# Patient Record
Sex: Male | Born: 1955 | Race: White | Hispanic: No | Marital: Married | Smoking: Former smoker
Health system: Southern US, Community
[De-identification: ages and names within clinical notes are randomized; demographics above are authoritative.]

## PROBLEM LIST (undated history)

## (undated) DIAGNOSIS — E782 Mixed hyperlipidemia: Secondary | ICD-10-CM

## (undated) DIAGNOSIS — Z87891 Personal history of nicotine dependence: Secondary | ICD-10-CM

## (undated) HISTORY — DX: Mixed hyperlipidemia: E78.2

## (undated) HISTORY — DX: Personal history of nicotine dependence: Z87.891

## (undated) HISTORY — PX: TOE AMPUTATION: SHX809

---

## 2015-05-13 ENCOUNTER — Emergency Department (INDEPENDENT_AMBULATORY_CARE_PROVIDER_SITE_OTHER)
Admission: EM | Admit: 2015-05-13 | Discharge: 2015-05-13 | Disposition: A | Discharge status: Home / Self Care | Payer: BLUE CROSS/BLUE SHIELD | Source: Home / Self Care | Attending: Family Medicine | Admitting: Family Medicine

## 2015-05-13 ENCOUNTER — Encounter: Payer: Self-pay | Admitting: Emergency Medicine

## 2015-05-13 DIAGNOSIS — J019 Acute sinusitis, unspecified: Secondary | ICD-10-CM | POA: Diagnosis not present

## 2015-05-13 MED ORDER — FLUTICASONE PROPIONATE 50 MCG/ACT NA SUSP: 2.0000 | Freq: Every day | NASAL | Status: DC

## 2015-05-13 MED ORDER — AMOXICILLIN-POT CLAVULANATE 875-125 MG PO TABS: 1.0000 | ORAL_TABLET | Freq: Two times a day (BID) | ORAL | Status: DC

## 2015-05-13 NOTE — ED Provider Notes (Signed)
CSN: [id number]     Arrival date & time 05/13/15  1311 History   First MD Initiated Contact with Patient 05/13/15 1358     Chief Complaint  Patient presents with  . Facial Pain   (Consider location/radiation/quality/duration/timing/severity/associated sxs/prior Treatment) HPI  Pt is a 60yo male [facility] with c/o sinus pressure and pain with headaches and nosebleeds for 5 days.  Pt also reports intermittent dizziness.  He has been using a 12 hour decongestant nose spray every night "for a long time" because it helps him breath at night. Pt does not recall name of medication but states it is a generic.  Reports only a little congestion.  Pain is aching and sore, mild to moderate in severity, worse when trying to blow his nose. Denies fever, chills, n/v/d.   History reviewed. No pertinent past medical history. Past Surgical History  Procedure Laterality Date  . Toe amputation     Family History  Problem Relation Age of Onset  . Diabetes Mother   . Hyperlipidemia Mother   . Diabetes Sister   . Hyperlipidemia Sister    Social History  Substance Use Topics  . Smoking status: Former Games developermoker  . Smokeless tobacco: Current User  . Alcohol Use: No    Review of Systems  Constitutional: Negative for fever and chills.  HENT: Positive for congestion, hearing loss ( "clogged feeling"), nosebleeds, postnasal drip, rhinorrhea and sinus pressure. Negative for ear pain, sore throat, trouble swallowing and voice change.   Respiratory: Negative for cough and shortness of breath.   Cardiovascular: Negative for chest pain and palpitations.  Gastrointestinal: Negative for nausea, vomiting, abdominal pain and diarrhea.  Musculoskeletal: Negative for myalgias, back pain and arthralgias.  Skin: Negative for rash.  Neurological: Positive for dizziness and headaches. Negative for light-headedness.    Allergies  Review of patient's allergies indicates not on file.  Home Medications   Prior to  Admission medications   Medication Sig Start Date End Date Taking? Authorizing Provider  amoxicillin-clavulanate (AUGMENTIN) 875-125 MG tablet Take 1 tablet by mouth 2 (two) times daily. For 10 days 05/13/15   Junius FinnerErin O'Malley, PA-C  fluticasone [facility](FLONASE) 50 MCG/ACT nasal spray Place 2 sprays into both nostrils daily. 05/13/15   Junius FinnerErin O'Malley, PA-C   Meds Ordered and Administered this Visit  Medications - No data to display  BP 126/85 mmHg  Pulse 96  Temp(Src) 98.1 F (36.7 C) (Oral)  Wt 226 lb (102.513 kg)  SpO2 97% No data found.   Physical Exam  Constitutional: He appears well-developed and well-nourished.  HENT:  Head: Normocephalic and atraumatic.  Right Ear: Hearing, tympanic membrane, external ear and ear canal normal.  Left Ear: Hearing, tympanic membrane, external ear and ear canal normal.  Nose: Mucosal edema present. Right sinus exhibits maxillary sinus tenderness and frontal sinus tenderness. Left sinus exhibits maxillary sinus tenderness and frontal sinus tenderness.  Mouth/Throat: Uvula is midline, oropharynx is clear and moist and mucous membranes are normal.  Eyes: Conjunctivae are normal. No scleral icterus.  Neck: Normal range of motion. Neck supple.  Cardiovascular: Normal rate, regular rhythm and normal heart sounds.   Pulmonary/Chest: Effort normal and breath sounds normal. No respiratory distress. He has no wheezes. He has no rales. He exhibits no tenderness.  Abdominal: Soft. He exhibits no distension and no mass. There is no tenderness. There is no rebound and no guarding.  Genitourinary: Penis normal.  Musculoskeletal: Normal range of motion.  Neurological: He is alert.  Skin: Skin is  warm and dry.  Nursing note and vitals reviewed.   ED Course  Procedures (including critical care time)  Labs Review Labs Reviewed - No data to display  Imaging Review No results found.    MDM   1. Acute rhinosinusitis      Pt c/o facial pain, nose bleeds, and  dizziness. Reports using a 12 hours decongestant nose spray nightly for "a long time."    Will tx for bacteria cause of sinus pain, however, explained to pt symptoms may be coming from over use of the nasal spray as only some medications are meant to be used daily, others are meant only for 3 consecutive days. Encouraged pt to read instructions on his nose spray carefully.   Rx: augmentin and flonase. Advised pt he can also use over the counter nasal saline, which he can use multiple times a day if needed.  Advised pt to use acetaminophen and ibuprofen as needed for fever and pain. Encouraged rest and fluids. F/u with PCP in 7-10 days if not improving, sooner if worsening. Pt verbalized understanding and agreement with tx plan.     Junius Finner, PA-C 05/14/15 [phone]

## 2015-05-13 NOTE — Discharge Instructions (Signed)
You may take 400-600mg  Ibuprofen (Motrin) every 6-8 hours for fever and pain  Alternate with Tylenol  You may take 500mg  Tylenol every 4-6 hours as needed for fever and pain  Follow-up with your primary care provider next week for recheck of symptoms if not improving.  Be sure to drink plenty of fluids and rest, at least 8hrs of sleep a night, preferably more while you are sick. Return urgent care or go to closest ER if you cannot keep down fluids/signs of dehydration, fever not reducing with Tylenol, difficulty breathing/wheezing, stiff neck, worsening condition, or other concerns (see below)  Please take antibiotics as prescribed and be sure to complete entire course even if you start to feel better to ensure infection does not come back.  Please read the instructions on the back of the nose spray that you have been using at night.  Some medications are only meant for a short 3-4 day period.  If used continuously, it may worsen your sinus symptoms including increased sinus pain, irritation, and drying.

## 2015-05-13 NOTE — ED Notes (Signed)
Pt c/o facial pain, HA and sinus pressure x5 days, states her had a nosebleed on Thursday and is having intermittent dizziness.

## 2016-06-23 ENCOUNTER — Encounter: Payer: Self-pay | Admitting: Sports Medicine

## 2016-06-23 ENCOUNTER — Ambulatory Visit (INDEPENDENT_AMBULATORY_CARE_PROVIDER_SITE_OTHER): Payer: BLUE CROSS/BLUE SHIELD

## 2016-06-23 ENCOUNTER — Ambulatory Visit (INDEPENDENT_AMBULATORY_CARE_PROVIDER_SITE_OTHER): Payer: BLUE CROSS/BLUE SHIELD | Admitting: Sports Medicine

## 2016-06-23 DIAGNOSIS — Z0000 Encounter for general adult medical examination without abnormal findings: Secondary | ICD-10-CM

## 2016-06-23 DIAGNOSIS — H9193 Unspecified hearing loss, bilateral: Secondary | ICD-10-CM | POA: Diagnosis not present

## 2016-06-23 DIAGNOSIS — H9190 Unspecified hearing loss, unspecified ear: Secondary | ICD-10-CM | POA: Insufficient documentation

## 2016-06-23 DIAGNOSIS — M170 Bilateral primary osteoarthritis of knee: Secondary | ICD-10-CM

## 2016-06-23 DIAGNOSIS — E782 Mixed hyperlipidemia: Secondary | ICD-10-CM

## 2016-06-23 DIAGNOSIS — J310 Chronic rhinitis: Secondary | ICD-10-CM

## 2016-06-23 LAB — COMPREHENSIVE METABOLIC PANEL
ALT: 21 U/L (ref 9–46)
AST: 21 U/L (ref 10–35)
Alkaline Phosphatase: 70 U/L (ref 40–115)
BUN: 16 mg/dL (ref 7–25)
Calcium: 9.6 mg/dL (ref 8.6–10.3)
Creat: 1 mg/dL (ref 0.70–1.33)
Glucose, Bld: 107 mg/dL — ABNORMAL HIGH (ref 65–99)
Potassium: 4.8 mmol/L (ref 3.5–5.3)
Sodium: 142 mmol/L (ref 135–146)
Total Protein: 7.4 g/dL (ref 6.1–8.1)

## 2016-06-23 LAB — LIPID PANEL W/REFLEX DIRECT LDL
Cholesterol: 257 mg/dL — ABNORMAL HIGH (ref <200)
HDL: 30 mg/dL — ABNORMAL LOW (ref >40)
LDL-Cholesterol: 193 mg/dL — ABNORMAL HIGH
Non-HDL Cholesterol (Calc): 227 mg/dL — ABNORMAL HIGH (ref <130)
Total CHOL/HDL Ratio: 8.6 ratio — ABNORMAL HIGH (ref <5.0)
Triglycerides: 171 mg/dL — ABNORMAL HIGH (ref <150)

## 2016-06-23 LAB — CBC
HCT: 42.7 % (ref 38.5–50.0)
Hemoglobin: 14.2 g/dL (ref 13.2–17.1)
MCH: 28.2 pg (ref 27.0–33.0)
MCHC: 33.3 g/dL (ref 32.0–36.0)
MCV: 84.9 fL (ref 80.0–100.0)
MPV: 9.3 fL (ref 7.5–12.5)
Platelets: 194 10*3/uL (ref 140–400)
RBC: 5.03 MIL/uL (ref 4.20–5.80)
RDW: 14.2 % (ref 11.0–15.0)
WBC: 6.3 10*3/uL (ref 3.8–10.8)

## 2016-06-23 LAB — COMPREHENSIVE METABOLIC PANEL WITH GFR
Albumin: 4.2 g/dL (ref 3.6–5.1)
CO2: 29 mmol/L (ref 20–31)
Chloride: 106 mmol/L (ref 98–110)
Total Bilirubin: 0.7 mg/dL (ref 0.2–1.2)

## 2016-06-23 LAB — TSH: TSH: 1.53 mIU/L (ref 0.40–4.50)

## 2016-06-23 LAB — HEMOGLOBIN A1C
Hgb A1c MFr Bld: 5.9 % — ABNORMAL HIGH (ref <5.7)
Mean Plasma Glucose: 123 mg/dL

## 2016-06-23 MED ORDER — FLUTICASONE PROPIONATE 50 MCG/ACT NA SUSP: NASAL | 3 refills | Status: DC

## 2016-06-23 MED ORDER — MELOXICAM 15 MG PO TABS: ORAL_TABLET | ORAL | 3 refills | Status: DC

## 2016-06-23 NOTE — Assessment & Plan Note (Signed)
X-rays, switch to meloxicam from aspirin. Home rehabilitation exercises, pain is predominantly patellofemoral.

## 2016-06-23 NOTE — Assessment & Plan Note (Signed)
Patient has a long history of decongestant use, may continue nasal saline but needs to add Flonase

## 2016-06-23 NOTE — Progress Notes (Signed)
  Subjective:    CC: Establish care.   HPI:  Brian Boyer is a pleasant 61 year old male, he is here for a physical, he also has a couple of complaints.  Runny nose: Present year-round, has only been using a decongestant and nasal saline. Occasionally gets a stuffy sensation in his left ear. This also create some degree of hearing loss with occasional tinnitus. Sounds as though he's never tried a nasal steroid.  Knee pain: Bilateral, anterior, worse with sitting, squatting, going up and down stairs, tries some aspirin but has really never had another NSAID. No mechanical symptoms, only minimal swelling..  Past medical history:  Negative.  See flowsheet/record as well for more information.  Surgical history: Negative.  See flowsheet/record as well for more information.  Family history: Negative.  See flowsheet/record as well for more information.  Social history: Negative.  See flowsheet/record as well for more information.  Allergies, and medications have been entered into the medical record, reviewed, and no changes needed.    Review of Systems: No headache, visual changes, nausea, vomiting, diarrhea, constipation, dizziness, abdominal pain, skin rash, fevers, chills, night sweats, swollen lymph nodes, weight loss, chest pain, body aches, joint swelling, muscle aches, shortness of breath, mood changes, visual or auditory hallucinations.  Objective:    General: Well Developed, well nourished, and in no acute distress.  Neuro: Alert and oriented x3, extra-ocular muscles intact, sensation grossly intact. Cranial nerves II through XII are intact, motor, sensory, and coordinative functions are all intact. HEENT: Normocephalic, atraumatic, pupils equal round reactive to light, neck supple, no masses, no lymphadenopathy, thyroid nonpalpable. Oropharynx, nasopharynx, external ear canals are unremarkable. Skin: Warm and dry, no rashes noted.  Cardiac: Regular rate and rhythm, no murmurs rubs or gallops.    Respiratory: Clear to auscultation bilaterally. Not using accessory muscles, speaking in full sentences.  Abdominal: Soft, nontender, nondistended, positive bowel sounds, no masses, no organomegaly.  Musculoskeletal: Shoulder, elbow, wrist, hip, knee, ankle stable, and with full range of motion. Bilateral knees: Only minimal swelling, he did have tenderness under the medial and lateral patellar facets of both knees ROM normal in flexion and extension and lower leg rotation. Ligaments with solid consistent endpoints including ACL, PCL, LCL, MCL. Negative Mcmurray's and provocative meniscal tests. Non painful patellar compression. Patellar and quadriceps tendons unremarkable. Hamstring and quadriceps strength is normal.  Mild osteoarthritis on both knee x-rays.  Impression and Recommendations:    The patient was counselled, risk factors were discussed, anticipatory guidance given.  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 Home rehabilitation exercises, pain is predominantly patellofemoral.  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 Referral to audiology  Chronic rhinitis Patient has a long history of decongestant use, may continue nasal saline but needs to add Flonase  Annual physical exam Checking routine blood work, getting him set up for ColoGuard.

## 2016-06-23 NOTE — Assessment & Plan Note (Signed)
Checking routine blood work, getting him set up for ColoGuard.

## 2016-06-23 NOTE — Assessment & Plan Note (Signed)
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 Referral to audiology

## 2016-06-24 DIAGNOSIS — E782 Mixed hyperlipidemia: Secondary | ICD-10-CM

## 2016-06-24 HISTORY — DX: Mixed hyperlipidemia: E78.2

## 2016-06-24 LAB — HIV ANTIBODY (ROUTINE TESTING W REFLEX): HIV 1&2 Ab, 4th Generation: NONREACTIVE

## 2016-06-24 LAB — VITAMIN D 25 HYDROXY (VIT D DEFICIENCY, FRACTURES): Vit D, 25-Hydroxy: 27 ng/mL — ABNORMAL LOW (ref 30–100)

## 2016-06-24 LAB — HEPATITIS C ANTIBODY: HCV Ab: NEGATIVE

## 2016-06-24 LAB — RHEUMATOID FACTOR: Rheumatoid fact SerPl-aCnc: <14 [IU]/mL (ref <14)

## 2016-06-24 LAB — CYCLIC CITRUL PEPTIDE ANTIBODY, IGG: Cyclic Citrullin Peptide Ab: <16 Units

## 2016-06-24 LAB — URIC ACID: Uric Acid, Serum: 5.7 mg/dL (ref 4.0–8.0)

## 2016-06-24 MED ORDER — VITAMIN D (ERGOCALCIFEROL) 1.25 MG (50000 UNIT) PO CAPS: 50000.0000 [IU] | ORAL_CAPSULE | ORAL | 0 refills | Status: DC

## 2016-06-24 NOTE — Assessment & Plan Note (Addendum)
Awaiting patient approval to start statin..  Starting Lipitor 20, recheck in 3 months

## 2016-06-24 NOTE — Addendum Note (Signed)
Addended by: Monica BectonHEKKEKANDAM, THOMAS J on: 06/24/2016 05:51 PM   Modules accepted: Orders

## 2016-06-26 MED ORDER — ATORVASTATIN CALCIUM 20 MG PO TABS: 20.0000 mg | ORAL_TABLET | Freq: Every day | ORAL | 0 refills | Status: DC

## 2016-06-26 NOTE — Addendum Note (Signed)
Addended by: Monica BectonHEKKEKANDAM, Drea Jurewicz J on: 06/26/2016 08:32 AM   Modules accepted: Orders

## 2016-07-21 ENCOUNTER — Ambulatory Visit: Payer: BLUE CROSS/BLUE SHIELD | Admitting: Sports Medicine

## 2016-07-29 ENCOUNTER — Other Ambulatory Visit: Payer: Self-pay

## 2016-07-29 DIAGNOSIS — E782 Mixed hyperlipidemia: Secondary | ICD-10-CM

## 2016-07-29 MED ORDER — ATORVASTATIN CALCIUM 20 MG PO TABS: 20.0000 mg | ORAL_TABLET | Freq: Every day | ORAL | 3 refills | Status: DC

## 2017-06-01 ENCOUNTER — Encounter: Payer: Self-pay | Admitting: Family Medicine

## 2017-06-01 ENCOUNTER — Ambulatory Visit (INDEPENDENT_AMBULATORY_CARE_PROVIDER_SITE_OTHER): Payer: BLUE CROSS/BLUE SHIELD | Admitting: Family Medicine

## 2017-06-01 VITALS — BP 150/77 | HR 79 | Ht 73.0 in | Wt 230.0 lb

## 2017-06-01 DIAGNOSIS — B029 Zoster without complications: Secondary | ICD-10-CM

## 2017-06-01 MED ORDER — GABAPENTIN 300 MG PO CAPS: ORAL_CAPSULE | ORAL | 3 refills | Status: DC

## 2017-06-01 MED ORDER — VALACYCLOVIR HCL 1 G PO TABS: 1000.0000 mg | ORAL_TABLET | Freq: Three times a day (TID) | ORAL | 1 refills | Status: DC

## 2017-06-01 NOTE — Patient Instructions (Signed)
Thank you for coming in today. You have shingles.  Take valtrex three times daily for 1 week.  Take gabapentin as needed for pain.  Recheck as needed.    Shingles Shingles, which is also known as herpes zoster, is an infection that causes a painful skin rash and fluid-filled blisters. Shingles is not related to genital herpes, which is a sexually transmitted infection. Shingles only develops in people who:  Have had chickenpox.  Have received the chickenpox vaccine. (This is rare.)  What are the causes? Shingles is caused by varicella-zoster virus (VZV). This is the same virus that causes chickenpox. After exposure to VZV, the virus stays in the body in an inactive (dormant) state. Shingles develops if the virus reactivates. This can happen many years after the initial exposure to VZV. It is not known what causes this virus to reactivate. What increases the risk? People who have had chickenpox or received the chickenpox vaccine are at risk for shingles. Infection is more common in people who:  Are older than age 61.  Have a weakened defense (immune) system, such as those with HIV, AIDS, or cancer.  Are taking medicines that weaken the immune system, such as transplant medicines.  Are under great stress.  What are the signs or symptoms? Early symptoms of this condition include itching, tingling, and pain in an area on your skin. Pain may be described as burning, stabbing, or throbbing. A few days or weeks after symptoms start, a painful red rash appears, usually on one side of the body in a bandlike or beltlike pattern. The rash eventually turns into fluid-filled blisters that break open, scab over, and dry up in about 2-3 weeks. At any time during the infection, you may also develop:  A fever.  Chills.  A headache.  An upset stomach.  How is this diagnosed? This condition is diagnosed with a skin exam. Sometimes, skin or fluid samples are taken from the blisters before a  diagnosis is made. These samples are examined under a microscope or sent to a lab for testing. How is this treated? There is no specific cure for this condition. Your health care provider will probably prescribe medicines to help you manage pain, recover more quickly, and avoid long-term problems. Medicines may include:  Antiviral drugs.  Anti-inflammatory drugs.  Pain medicines.  If the area involved is on your face, you may be referred to a specialist, such as an eye doctor (ophthalmologist) or an ear, nose, and throat (ENT) doctor to help you avoid eye problems, chronic pain, or disability. Follow these instructions at home: Medicines  Take medicines only as directed by your health care provider.  Apply an anti-itch or numbing cream to the affected area as directed by your health care provider. Blister and Rash Care  Take a cool bath or apply cool compresses to the area of the rash or blisters as directed by your health care provider. This may help with pain and itching.  Keep your rash covered with a loose bandage (dressing). Wear loose-fitting clothing to help ease the pain of material rubbing against the rash.  Keep your rash and blisters clean with mild soap and cool water or as directed by your health care provider.  Check your rash every day for signs of infection. These include redness, swelling, and pain that lasts or increases.  Do not pick your blisters.  Do not scratch your rash. General instructions  Rest as directed by your health care provider.  Keep all follow-up  visits as directed by your health care provider. This is important.  Until your blisters scab over, your infection can cause chickenpox in people who have never had it or been vaccinated against it. To prevent this from happening, avoid contact with other people, especially: ? Babies. ? Pregnant women. ? Children who have eczema. ? Elderly people who have transplants. ? People who have chronic  illnesses, such as leukemia or AIDS. Contact a health care provider if:  Your pain is not relieved with prescribed medicines.  Your pain does not get better after the rash heals.  Your rash looks infected. Signs of infection include redness, swelling, and pain that lasts or increases. Get help right away if:  The rash is on your face or nose.  You have facial pain, pain around your eye area, or loss of feeling on one side of your face.  You have ear pain or you have ringing in your ear.  You have loss of taste.  Your condition gets worse. This information is not intended to replace advice given to you by your health care provider. Make sure you discuss any questions you have with your health care provider. Document Released: 04/28/2005 Document Revised: 12/23/2015 Document Reviewed: 03/09/2014 Elsevier Interactive Patient Education  2018 [employer].

## 2017-06-01 NOTE — Progress Notes (Signed)
Brian Boyer is a 62 y.o. male who [facility] today for pain and rash.  Markelle notes onset of pain across his right trunk last week with onset of rash less than 72 hours ago Saturday.  He notes the rash is spread along his ribs on his right side.  He notes that he was sick preceding the pain and rash but is feeling better now with no fevers or chills vomiting or diarrhea.  He has not tried any treatment yet.  He suspects shingles based on the appearance of the rash although he has never had shingles in the past.  He notes that he had chickenpox as a child.   Past Medical History:  Diagnosis Date  . Hyperlipidemia, mixed 06/24/2016   Past Surgical History:  Procedure Laterality Date  . TOE AMPUTATION     Social History   Tobacco Use  . Smoking status: Former Games developermoker  . Smokeless tobacco: Current User  Substance Use Topics  . Alcohol use: No   family history includes Diabetes in his mother and sister; Hyperlipidemia in his mother and sister.  ROS as above:  Medications: Current Outpatient Medications  Medication Sig Dispense Refill  . atorvastatin (LIPITOR) 20 MG tablet Take 1 tablet (20 mg total) by mouth daily. (Patient not taking: Reported on 06/01/2017) 90 tablet 3  . gabapentin (NEURONTIN) 300 MG capsule One tab PO qHS for a week, then BID for a week, then TID. May double weekly to a max of 3,600mg /day 180 capsule 3  . valACYclovir (VALTREX) 1000 MG tablet Take 1 tablet (1,000 mg total) by mouth 3 (three) times daily. 21 tablet 1   No current facility-administered medications for this visit.    No Known Allergies  Health Maintenance Health Maintenance  Topic Date Due  . TETANUS/TDAP  04/10/1976  . COLONOSCOPY  04/11/2007  . INFLUENZA VACCINE  12/10/2016  . Hepatitis C Screening  Completed  . HIV Screening  Completed     Exam:  BP (!)  150/77   Pulse 79   Ht 6\' 1"  (1.854 m)   Wt 230 lb (104.3 kg)   BMI 30.34 kg/m  Gen: Well NAD HEENT: EOMI,  MMM Lungs: Normal work of breathing. CTABL Heart: RRR no MRG Abd: NABS, Soft. Nondistended, Nontender Exts: Brisk capillary refill, warm and well perfused.  Trunk: Erythematous red plaque-like rash in a dermatomal pattern with clear vesicles consistent in appearance with shingles  Lab Results  Component Value Date   CREATININE 1.00 06/23/2016      Assessment and Plan: 62 y.o. male with shingles.  Patient is still within treatment window.  Treatment with Valtrex and gabapentin.  Patient has normal renal function less than 1 year ago therefore full dose of Valtrex is reasonable.  Follow-up with PCP as needed.  Advised patient to avoid contact with folks who are immunocompromised or very young. Handout provided.    No orders of the defined types were placed in this encounter.  Meds ordered this encounter  Medications  . valACYclovir (VALTREX) 1000 MG tablet    Sig: Take 1 tablet (1,000 mg total) by mouth 3 (three) times daily.    Dispense:  21 tablet    Refill:  1  . gabapentin (NEURONTIN) 300 MG capsule    Sig: One tab PO qHS for a week, then BID for a week, then TID. May double weekly to a max of 3,600mg /day  Dispense:  180 capsule    Refill:  3     Discussed warning signs or symptoms. Please see discharge instructions. Patient expresses understanding.  I spent 25 minutes with this patient, greater than 50% was face-to-face time counseling regarding ddx and treatment plan.

## 2017-06-25 ENCOUNTER — Other Ambulatory Visit: Payer: Self-pay | Admitting: Sports Medicine

## 2017-06-25 DIAGNOSIS — E782 Mixed hyperlipidemia: Secondary | ICD-10-CM

## 2017-12-10 ENCOUNTER — Ambulatory Visit (INDEPENDENT_AMBULATORY_CARE_PROVIDER_SITE_OTHER): Payer: Self-pay | Admitting: Physician Assistant

## 2017-12-10 ENCOUNTER — Ambulatory Visit (INDEPENDENT_AMBULATORY_CARE_PROVIDER_SITE_OTHER): Payer: Self-pay

## 2017-12-10 ENCOUNTER — Encounter: Payer: Self-pay | Admitting: Physician Assistant

## 2017-12-10 VITALS — BP 130/62 | HR 63 | Temp 97.8°F | Wt 224.0 lb

## 2017-12-10 DIAGNOSIS — R5383 Other fatigue: Secondary | ICD-10-CM

## 2017-12-10 DIAGNOSIS — R5381 Other malaise: Secondary | ICD-10-CM

## 2017-12-10 DIAGNOSIS — Z87891 Personal history of nicotine dependence: Secondary | ICD-10-CM

## 2017-12-10 DIAGNOSIS — R0602 Shortness of breath: Secondary | ICD-10-CM

## 2017-12-10 DIAGNOSIS — F17229 Nicotine dependence, chewing tobacco, with unspecified nicotine-induced disorders: Secondary | ICD-10-CM

## 2017-12-10 DIAGNOSIS — R42 Dizziness and giddiness: Secondary | ICD-10-CM

## 2017-12-10 LAB — GLUCOSE, POCT (MANUAL RESULT ENTRY): POC Glucose: 101 mg/dl — AB (ref 70–99)

## 2017-12-10 NOTE — Progress Notes (Signed)
HPI:                                                                Brian Boyer is a 62 y.o. male who presents to [facility]: Primary Care Sports Medicine today for malaise/lightheadedness  History is provided by patient and his wife.  [name] with PMH of GERD, smokeless tobacco dependence, former cigarette smoker who presents with 2 weeks of vague symptoms, which are gradually improving.  Reports intermittent lightheadedness, generalized weakness, fatigue/malaise, muscle cramps, dyspnea and GI upset. Symptoms have been waxing and waning. Symptoms began after exerting himself outdoors in the heat. Wife states he has not been himself, sleeping more than usual and less active for several weeks. He has been drinking OJ, Gatorade and eating light salt to hydrate.   No flowsheet data found.  No flowsheet data found.    Past Medical History:  Diagnosis Date  . Former smoker, stopped smoking in distant past   . Hyperlipidemia, mixed 06/24/2016   Past Surgical History:  Procedure Laterality Date  . TOE AMPUTATION     Social History   Tobacco Use  . Smoking status: Former Smoker    Packs/day: 1.00    Years: 15.00    Pack years: 15.00    Types: Cigarettes    Last attempt to quit: 04/10/1998    Years since quitting: 19.7  . Smokeless tobacco: Current User    Types: Chew  Substance Use Topics  . Alcohol use: No   family history includes Diabetes in his mother and sister; Hyperlipidemia in his mother and sister.    ROS: Review of Systems  Constitutional: Positive for malaise/fatigue. Negative for chills and fever.  Eyes: Negative for blurred vision and double vision.  Respiratory: Positive for shortness of breath.   Cardiovascular: Negative.   Gastrointestinal: Positive for abdominal pain.       + loose stools  Genitourinary: Negative.   Neurological: Positive for dizziness and weakness. Negative for focal weakness and headaches.   Psychiatric/Behavioral: The patient is nervous/anxious.      Medications: Current Outpatient Medications  Medication Sig Dispense Refill  . omeprazole (PRILOSEC) 20 MG capsule Take 20 mg by mouth daily.    Marland Kitchen atorvastatin (LIPITOR) 20 MG tablet TAKE 1 TABLET DAILY 90 tablet 3   No current facility-administered medications for this visit.    No Known Allergies     Objective:  BP 130/62   Pulse 63   Temp 97.8 F (36.6 C) (Oral)   Wt 224 lb (101.6 kg)   SpO2 96%   BMI 29.55 kg/m  Gen:  alert, not ill-appearing, no distress, appropriate for age HEENT: head normocephalic without obvious abnormality, conjunctiva and cornea clear, trachea midline Pulm: Normal work of breathing, normal phonation, clear to auscultation bilaterally, no wheezes, rales or rhonchi CV: Normal rate, regular rhythm, s1 and s2 distinct, no murmurs, clicks or rubs  Neuro: alert and oriented x 3, no tremor MSK: extremities atraumatic, normal gait and station Skin: intact, no rashes on exposed skin, no jaundice, no cyanosis Psych: well-groomed, cooperative, good eye contact, euthymic mood, affect mood-congruent, speech is articulate, and thought processes clear and goal-directed  No data found.   No results found for this or any previous visit (  from the past 72 hour(s)). No results found.    Assessment and Plan: 62 y.o. 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  Former smoker, stopped smoking in distant past   - Afebrile, no tachypnea, no tachycardia, no orthostasis, SpO2 96% on RA at rest - POC  glucose wnl - CXR, UA, CBC and CMP pending to assess for anemia, electrolyte disturbance, AKI, or sign of systemic infection to explain malaise/fatigue - I think it is reassuring that his symptoms are gradually improving - encouraged PO hydration, taking time with position changes, wearing compression socks/stockings, and increasing dietary salt - encouraged to follow-up with PCP for routine cancer screening / preventive care   Patient education and anticipatory guidance given Patient agrees with treatment plan Follow-up with PCP in 1 week for dyspnea/spirometry or sooner as needed if symptoms worsen or fail to improve  Levonne Hubertharley E. Cummings PA-C

## 2017-12-10 NOTE — Patient Instructions (Addendum)
- Drink enough fluids to keep your urine pale yellow.  - Avoid caffeine, sweetened beverages and soda (which can dehydrate you further) - Take time with position changes, especially sitting to standing - Wear compression socks - Increase dietary salt  Orthostatic Hypotension Orthostatic hypotension is a sudden drop in blood pressure that happens when you quickly change positions, such as when you get up from a seated or lying position. Blood pressure is a measurement of how strongly, or weakly, your blood is pressing against the walls of your arteries. Arteries are blood vessels that carry blood from your heart throughout your body. When blood pressure is too low, you may not get enough blood to your brain or to the rest of your organs. This can cause weakness, light-headedness, rapid heartbeat, and fainting. This can last for just a few seconds or for up to a few minutes. Orthostatic hypotension is usually not a serious problem. However, if it happens frequently or gets worse, it may be a sign of something more serious. What are the causes? This condition may be caused by:  Sudden changes in posture, such as standing up quickly after you have been sitting or lying down.  Blood loss.  Loss of body fluids (dehydration).  Heart problems.  Hormone (endocrine) problems.  Pregnancy.  Severe infection.  Lack of certain nutrients.  Severe allergic reactions (anaphylaxis).  Certain medicines, such as blood pressure medicine or medicines that make the body lose excess fluids (diuretics). Sometimes, this condition can be caused by not taking medicine as directed, such as taking too much of a certain medicine.  What increases the risk? Certain factors can make you more likely to develop orthostatic hypotension, including:  Age. Risk increases as you get older.  Conditions that affect the heart or the central nervous system.  Taking certain medicines, such as blood pressure medicine or  diuretics.  Being pregnant.  What are the signs or symptoms? Symptoms of this condition may include:  Weakness.  Light-headedness.  Dizziness.  Blurred vision.  Fatigue.  Rapid heartbeat.  Fainting, in severe cases.  How is this diagnosed? This condition is diagnosed based on:  Your medical history.  Your symptoms.  Your blood pressure measurement. Your health care provider will check your blood pressure when you are: ? Lying down. ? Sitting. ? Standing.  A blood pressure reading is recorded as two numbers, such as "120 over 80" (or 120/80). The first ("top") number is called the systolic pressure. It is a measure of the pressure in your arteries as your heart beats. The second ("bottom") number is called the diastolic pressure. It is a measure of the pressure in your arteries when your heart relaxes between beats. Blood pressure is measured in a unit called mm Hg. Healthy blood pressure for adults is 120/80. If your blood pressure is below 90/60, you may be diagnosed with hypotension. Other information or tests that may be used to diagnose orthostatic hypotension include:  Your other vital signs, such as your heart rate and temperature.  Blood tests.  Tilt table test. For this test, you will be safely secured to a table that moves you from a lying position to an upright position. Your heart rhythm and blood pressure will be monitored during the test.  How is this treated? Treatment for this condition may include:  Changing your diet. This may involve eating more salt (sodium) or drinking more water.  Taking medicines to raise your blood pressure.  Changing the dosage of certain  medicines you are taking that might be lowering your blood pressure.  Wearing compression stockings. These stockings help to prevent blood clots and reduce swelling in your legs.  In some cases, you may need to go to the hospital for:  Fluid replacement. This means you will receive  fluids through an IV tube.  Blood replacement. This means you will receive donated blood through an IV tube (transfusion).  Treating an infection or heart problems, if this applies.  Monitoring. You may need to be monitored while medicines that you are taking wear off.  Follow these instructions at home: Eating and drinking   Drink enough fluid to keep your urine clear or pale yellow.  Eat a healthy diet and follow instructions from your health care provider about eating or drinking restrictions. A healthy diet includes: ? Fresh fruits and vegetables. ? Whole grains. ? Lean meats. ? Low-fat dairy products.  Eat extra salt only as directed. Do not add extra salt to your diet unless your health care provider told you to do that.  Eat frequent, small meals.  Avoid standing up suddenly after eating. Medicines  Take over-the-counter and prescription medicines only as told by your health care provider. ? Follow instructions from your health care provider about changing the dosage of your current medicines, if this applies. ? Do not stop or adjust any of your medicines on your own. General instructions  Wear compression stockings as told by your health care provider.  Get up slowly from lying down or sitting positions. This gives your blood pressure a chance to adjust.  Avoid hot showers and excessive heat as directed by your health care provider.  Return to your normal activities as told by your health care provider. Ask your health care provider what activities are safe for you.  Do not use any products that contain nicotine or tobacco, such as cigarettes and e-cigarettes. If you need help quitting, ask your health care provider.  Keep all follow-up visits as told by your health care provider. This is important. Contact a health care provider if:  You vomit.  You have diarrhea.  You have a fever for more than 2-3 days.  You feel more thirsty than usual.  You feel weak  and tired. Get help right away if:  You have chest pain.  You have a fast or irregular heartbeat.  You develop numbness in any part of your body.  You cannot move your arms or your legs.  You have trouble speaking.  You become sweaty or feel lightheaded.  You faint.  You feel short of breath.  You have trouble staying awake.  You feel confused. This information is not intended to replace advice given to you by your health care provider. Make sure you discuss any questions you have with your health care provider. Document Released: 04/18/2002 Document Revised: 01/15/2016 Document Reviewed: 10/19/2015 Elsevier Interactive Patient Education  2018 [employer]   Shortness of Breath, Adult Shortness of breath is when a person has trouble breathing enough air, or when a person feels like she or he is having trouble breathing in enough air. Shortness of breath could be a sign of medical problem. Follow these instructions at home: Pay attention to any changes in your symptoms. Take these actions to help with your condition:  Do not smoke. Smoking is a common cause of shortness of breath. If you smoke and you need help quitting, ask your health care provider.  Avoid things that can irritate your airways,  such as: ? Mold. ? Dust. ? Air pollution. ? Chemical fumes. ? Things that can cause allergy symptoms (allergens), if you have allergies.  Keep your living space clean and free of mold and dust.  Rest as needed. Slowly return to your usual activities.  Take over-the-counter and prescription medicines, including oxygen and inhaled medicines, only as told by your health care provider.  Keep all follow-up visits as told by your health care provider. This is important.  Contact a health care provider if:  Your condition does not improve as soon as expected.  You have a hard time doing your normal activities, even after you rest.  You have new symptoms. Get help right away  if:  Your shortness of breath gets worse.  You have shortness of breath when you are resting.  You feel light-headed or you faint.  You have a cough that is not controlled with medicines.  You cough up blood.  You have pain with breathing.  You have pain in your chest, arms, shoulders, or abdomen.  You have a fever.  You cannot walk up stairs or exercise the way that you normally do. This information is not intended to replace advice given to you by your health care provider. Make sure you discuss any questions you have with your health care provider. Document Released: 01/21/2001 Document Revised: 11/17/2015 Document Reviewed: 10/04/2015 Elsevier Interactive Patient Education  [employer].

## 2017-12-11 LAB — COMPREHENSIVE METABOLIC PANEL
AG Ratio: 1.7 (calc) (ref 1.0–2.5)
ALT: 24 U/L (ref 9–46)
AST: 23 U/L (ref 10–35)
Albumin: 4.4 g/dL (ref 3.6–5.1)
Alkaline phosphatase (APISO): 68 U/L (ref 40–115)
BUN: 18 mg/dL (ref 7–25)
CHLORIDE: 104 mmol/L (ref 98–110)
CO2: 26 mmol/L (ref 20–32)
Calcium: 9.9 mg/dL (ref 8.6–10.3)
Creat: 1.06 mg/dL (ref 0.70–1.25)
GLOBULIN: 2.6 g/dL (ref 1.9–3.7)
Glucose, Bld: 103 mg/dL — ABNORMAL HIGH (ref 65–99)
Potassium: 4.4 mmol/L (ref 3.5–5.3)
Sodium: 140 mmol/L (ref 135–146)
TOTAL PROTEIN: 7 g/dL (ref 6.1–8.1)
Total Bilirubin: 0.9 mg/dL (ref 0.2–1.2)

## 2017-12-11 LAB — URINALYSIS, ROUTINE W REFLEX MICROSCOPIC
BILIRUBIN URINE: NEGATIVE
Glucose, UA: NEGATIVE
HGB URINE DIPSTICK: NEGATIVE
KETONES UR: NEGATIVE
Leukocytes, UA: NEGATIVE
NITRITE: NEGATIVE
PROTEIN: NEGATIVE
Specific Gravity, Urine: 1.018 (ref 1.001–1.03)
pH: 5.5 (ref 5.0–8.0)

## 2017-12-11 LAB — CBC
HCT: 43.5 % (ref 38.5–50.0)
Hemoglobin: 14.8 g/dL (ref 13.2–17.1)
MCH: 28.4 pg (ref 27.0–33.0)
MCHC: 34 g/dL (ref 32.0–36.0)
MCV: 83.5 fL (ref 80.0–100.0)
MPV: 10.4 fL (ref 7.5–12.5)
PLATELETS: 170 10*3/uL (ref 140–400)
RBC: 5.21 10*6/uL (ref 4.20–5.80)
RDW: 13.2 % (ref 11.0–15.0)
WBC: 5.8 10*3/uL (ref 3.8–10.8)

## 2017-12-13 NOTE — Progress Notes (Signed)
Good afternoon,  Your chest x-ray was normal.  There were no abnormalities in your blood work either.   I would still recommend all of the following: - Drink enough fluids to keep your urine pale yellow.  - Avoid caffeine, sweetened beverages and soda (which can dehydrate you further) - Take time with position changes, especially sitting to standing - Wear compression socks - Increase dietary salt  Follow-up with Dr. Karie Schwalbe if you are not improving with this treatment plan.

## 2017-12-22 ENCOUNTER — Encounter: Payer: Self-pay | Admitting: Physician Assistant

## 2017-12-22 DIAGNOSIS — R42 Dizziness and giddiness: Secondary | ICD-10-CM | POA: Insufficient documentation

## 2017-12-22 DIAGNOSIS — Z87891 Personal history of nicotine dependence: Secondary | ICD-10-CM | POA: Insufficient documentation

## 2017-12-22 DIAGNOSIS — F17229 Nicotine dependence, chewing tobacco, with unspecified nicotine-induced disorders: Secondary | ICD-10-CM | POA: Insufficient documentation

## 2017-12-22 DIAGNOSIS — R0602 Shortness of breath: Secondary | ICD-10-CM | POA: Insufficient documentation

## 2018-01-01 ENCOUNTER — Ambulatory Visit (INDEPENDENT_AMBULATORY_CARE_PROVIDER_SITE_OTHER): Payer: Self-pay | Admitting: Sports Medicine

## 2018-01-01 VITALS — BP 123/61 | HR 59 | Temp 97.9°F | Resp 18 | Wt 222.0 lb

## 2018-01-01 DIAGNOSIS — R0602 Shortness of breath: Secondary | ICD-10-CM

## 2018-01-01 LAB — PULMONARY FUNCTION TEST
FEV1/FVC: 79 %
FEV1: 3.19 L
FVC: 4.04 L

## 2018-01-01 MED ORDER — ALBUTEROL SULFATE (2.5 MG/3ML) 0.083% IN NEBU
2.5000 mg | INHALATION_SOLUTION | Freq: Once | RESPIRATORY_TRACT | Status: AC
  Administered 2018-01-01: 2.5 mg via RESPIRATORY_TRACT

## 2018-01-01 MED ORDER — DULOXETINE HCL 30 MG PO CPEP
30.0000 mg | ORAL_CAPSULE | Freq: Every day | ORAL | 3 refills | Status: DC
Start: 2018-01-01 — End: 2018-08-16

## 2018-01-01 NOTE — Assessment & Plan Note (Signed)
Nonspecific shortness of breath with a normal chest x-ray normal spirometry. He does have significant depressive symptoms. Adding some more labs, d-dimer, BNP, echo, myasthenia gravis panel, CK levels. I do think his principal symptom is depression, adding Cymbalta, return in 1 month for PHQ and GAD.

## 2018-01-01 NOTE — Progress Notes (Signed)
Subjective:    CC: Shortness of breath and spirometry  HPI: Brian Boyer is a pleasant 62 year old male, he endorses several episodes of shortness of breath both in exertion and at rest.  No chest pain.  This started when working outside in the heat, felt presyncopal, never had any chest pain, never passed out.  Rest and hydration resolve his symptoms.  Since then even at baseline he is felt like he has been fatigued, has difficulty getting a full breath, no pain when laying flat at night, no history of blood clots, no leg swelling.  He also endorses moderate to severe depressive mood symptoms, anhedonia, poor sleep.  He has thought about being dead but contracts for safety.  I reviewed the past medical history, family history, social history, surgical history, and allergies today and no changes were needed.  Please see the problem list section below in epic for further details.  Past Medical History: Past Medical History:  Diagnosis Date  . Former smoker, stopped smoking in distant past   . Hyperlipidemia, mixed 06/24/2016   Past Surgical History: Past Surgical History:  Procedure Laterality Date  . TOE AMPUTATION     Social History: Social History   Socioeconomic History  . Marital status: Married    Spouse name: Not on file  . Number of children: Not on file  . Years of education: Not on file  . Highest education level: Not on file  Occupational History  . Not on file  Social Needs  . Financial resource strain: Not on file  . Food insecurity:    Worry: Not on file    Inability: Not on file  . Transportation needs:    Medical: Not on file    Non-medical: Not on file  Tobacco Use  . Smoking status: Former Smoker    Packs/day: 1.00    Years: 15.00    Pack years: 15.00    Types: Cigarettes    Last attempt to quit: 04/10/1998    Years since quitting: 19.7  . Smokeless tobacco: Current User    Types: Chew  Substance and Sexual Activity  . Alcohol use: No  . Drug use: No  .  Sexual activity: Not on file  Lifestyle  . Physical activity:    Days per week: Not on file    Minutes per session: Not on file  . Stress: Not on file  Relationships  . Social connections:    Talks on phone: Not on file    Gets together: Not on file    Attends religious service: Not on file    Active member of club or organization: Not on file    Attends meetings of clubs or organizations: Not on file    Relationship status: Not on file  Other Topics Concern  . Not on file  Social History Narrative  . Not on file   Family History: Family History  Problem Relation Age of Onset  . Diabetes Mother   . Hyperlipidemia Mother   . Diabetes Sister   . Hyperlipidemia Sister    Allergies: No Known Allergies Medications: See med rec.  Review of Systems: No fevers, chills, night sweats, weight loss, chest pain, or shortness of breath.   Objective:    General: Well Developed, well nourished, and in no acute distress.  Neuro: Alert and oriented x3, extra-ocular muscles intact, sensation grossly intact.  HEENT: Normocephalic, atraumatic, pupils equal round reactive to light, neck supple, no masses, no lymphadenopathy, thyroid nonpalpable.  Skin: Warm and  dry, no rashes. Cardiac: Regular rate and rhythm, no murmurs rubs or gallops, no lower extremity edema.  Respiratory: Clear to auscultation bilaterally. Not using accessory muscles, speaking in full sentences.  Spirometry was normal, postbronchodilator he showed evidence of fatigue and poor effort.  Impression and Recommendations:    Shortness of breath Nonspecific shortness of breath with a normal chest x-ray normal spirometry. He does have significant depressive symptoms. Adding some more labs, d-dimer, BNP, echo, myasthenia gravis panel, CK levels. I do think his principal symptom is depression, adding Cymbalta, return in 1 month for PHQ and GAD.  I spent 25 minutes with this patient, greater than 50% was face-to-face time  counseling regarding the above diagnoses, this was separate from the time spent performing the pre-and postbronchodilator spirometry ___________________________________________ Ihor Austin. Benjamin Stain, M.D., ABFM., CAQSM. Primary Care and Sports Medicine [facility]  Adjunct Instructor of Family Medicine  [employer]

## 2018-01-12 ENCOUNTER — Ambulatory Visit (HOSPITAL_BASED_OUTPATIENT_CLINIC_OR_DEPARTMENT_OTHER): Admission: RE | Admit: 2018-01-12 | Payer: Self-pay | Source: Ambulatory Visit

## 2018-01-28 ENCOUNTER — Ambulatory Visit: Payer: Self-pay | Admitting: Sports Medicine

## 2018-08-16 ENCOUNTER — Other Ambulatory Visit: Payer: Self-pay

## 2018-08-16 ENCOUNTER — Encounter: Payer: Self-pay | Admitting: Sports Medicine

## 2018-08-16 ENCOUNTER — Telehealth (INDEPENDENT_AMBULATORY_CARE_PROVIDER_SITE_OTHER): Payer: Self-pay | Admitting: Sports Medicine

## 2018-08-16 DIAGNOSIS — M170 Bilateral primary osteoarthritis of knee: Secondary | ICD-10-CM

## 2018-08-16 DIAGNOSIS — E782 Mixed hyperlipidemia: Secondary | ICD-10-CM

## 2018-08-16 DIAGNOSIS — Z0000 Encounter for general adult medical examination without abnormal findings: Secondary | ICD-10-CM

## 2018-08-16 NOTE — Progress Notes (Signed)
Virtual Visit via WebEx   I connected with  Brian Boyer  on 08/16/18 via WebEx/MyChart and verified that I am speaking with the correct person using two identifiers.   I discussed the limitations, risks, security and privacy concerns of performing an evaluation and management service by WebEx/MyChart , including the higher likelihood of inaccurate diagnosis and treatment, and the availability of in person appointments.  We also discussed the likely need of an additional face to face encounter for complete and high quality delivery of care.  I also discussed with the patient that there may be a patient responsible charge related to this service. The patient expressed understanding and wishes to proceed.  Subjective:    CC: Follow-up  HPI: Brian Boyer is healthy, he had a upper respiratory illness recently, fevers, but is completely recovered, no respiratory symptoms at all.  He is due for some routine labs.  I reviewed the past medical history, family history, social history, surgical history, and allergies today and no changes were needed.  Please see the problem list section below in epic for further details.  Past Medical History: Past Medical History:  Diagnosis Date  . Former smoker, stopped smoking in distant past   . Hyperlipidemia, mixed 06/24/2016   Past Surgical History: Past Surgical History:  Procedure Laterality Date  . TOE AMPUTATION     Social History: Social History   Socioeconomic History  . Marital status: Married    Spouse name: Not on file  . Number of children: Not on file  . Years of education: Not on file  . Highest education level: Not on file  Occupational History  . Not on file  Social Needs  . Financial resource strain: Not on file  . Food insecurity:    Worry: Not on file    Inability: Not on file  . Transportation needs:    Medical: Not on file    Non-medical: Not on file  Tobacco Use  . Smoking status: Former Smoker    Packs/day: 1.00   Years: 15.00    Pack years: 15.00    Types: Cigarettes    Last attempt to quit: 04/10/1998    Years since quitting: 20.3  . Smokeless tobacco: Current User    Types: Chew  Substance and Sexual Activity  . Alcohol use: No  . Drug use: No  . Sexual activity: Not on file  Lifestyle  . Physical activity:    Days per week: Not on file    Minutes per session: Not on file  . Stress: Not on file  Relationships  . Social connections:    Talks on phone: Not on file    Gets together: Not on file    Attends religious service: Not on file    Active member of club or organization: Not on file    Attends meetings of clubs or organizations: Not on file    Relationship status: Not on file  Other Topics Concern  . Not on file  Social History Narrative  . Not on file   Family History: Family History  Problem Relation Age of Onset  . Diabetes Mother   . Hyperlipidemia Mother   . Diabetes Sister   . Hyperlipidemia Sister    Allergies: No Known Allergies Medications: See med rec.  Review of Systems: No fevers, chills, night sweats, weight loss, chest pain, or shortness of breath.   Objective:    General: Speaking full sentences, no audible heavy breathing.  Sounds alert and appropriately  interactive.  Appears well.  Face symmetric.  Extraocular movements intact.  Pupils equal and round.  No nasal flaring or accessory muscle use visualized.  No other physical exam performed due to the non-physical nature of this visit.  Impression and Recommendations:    Hyperlipidemia, mixed Checking routine labs.  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  I discussed the above assessment and treatment plan with the patient. The patient was provided an opportunity to ask questions and all were answered. The patient agreed with the plan and demonstrated an understanding of the instructions.    The patient was advised to call back or seek an in-person evaluation if the symptoms worsen or if the condition fails to improve as anticipated.   I provided 21 minutes of electronic video evaluation time during this encounter, less than 50% was time needed to gather information, review chart and records, explain the treatment plan to the patient, and complete documentation.   ___________________________________________ Ihor Austin. Benjamin Stain, M.D., ABFM., CAQSM. Primary Care and Sports Medicine [facility]  Adjunct Professor of Family Medicine  [employer]

## 2018-08-16 NOTE — Assessment & Plan Note (Signed)
Checking routine labs 

## 2018-08-16 NOTE — Assessment & Plan Note (Signed)
Only uses an occasional Tylenol, declines any further medications.

## 2018-09-25 LAB — COLOGUARD: Cologuard: NEGATIVE

## 2020-02-28 IMAGING — DX DG CHEST 2V
2 series · 2 of 2 positions shown · non-contrast
Comparison: None.

CLINICAL DATA: Shortness of breath for 1 week.

EXAM:
CHEST - 2 VIEW

[chest pa]
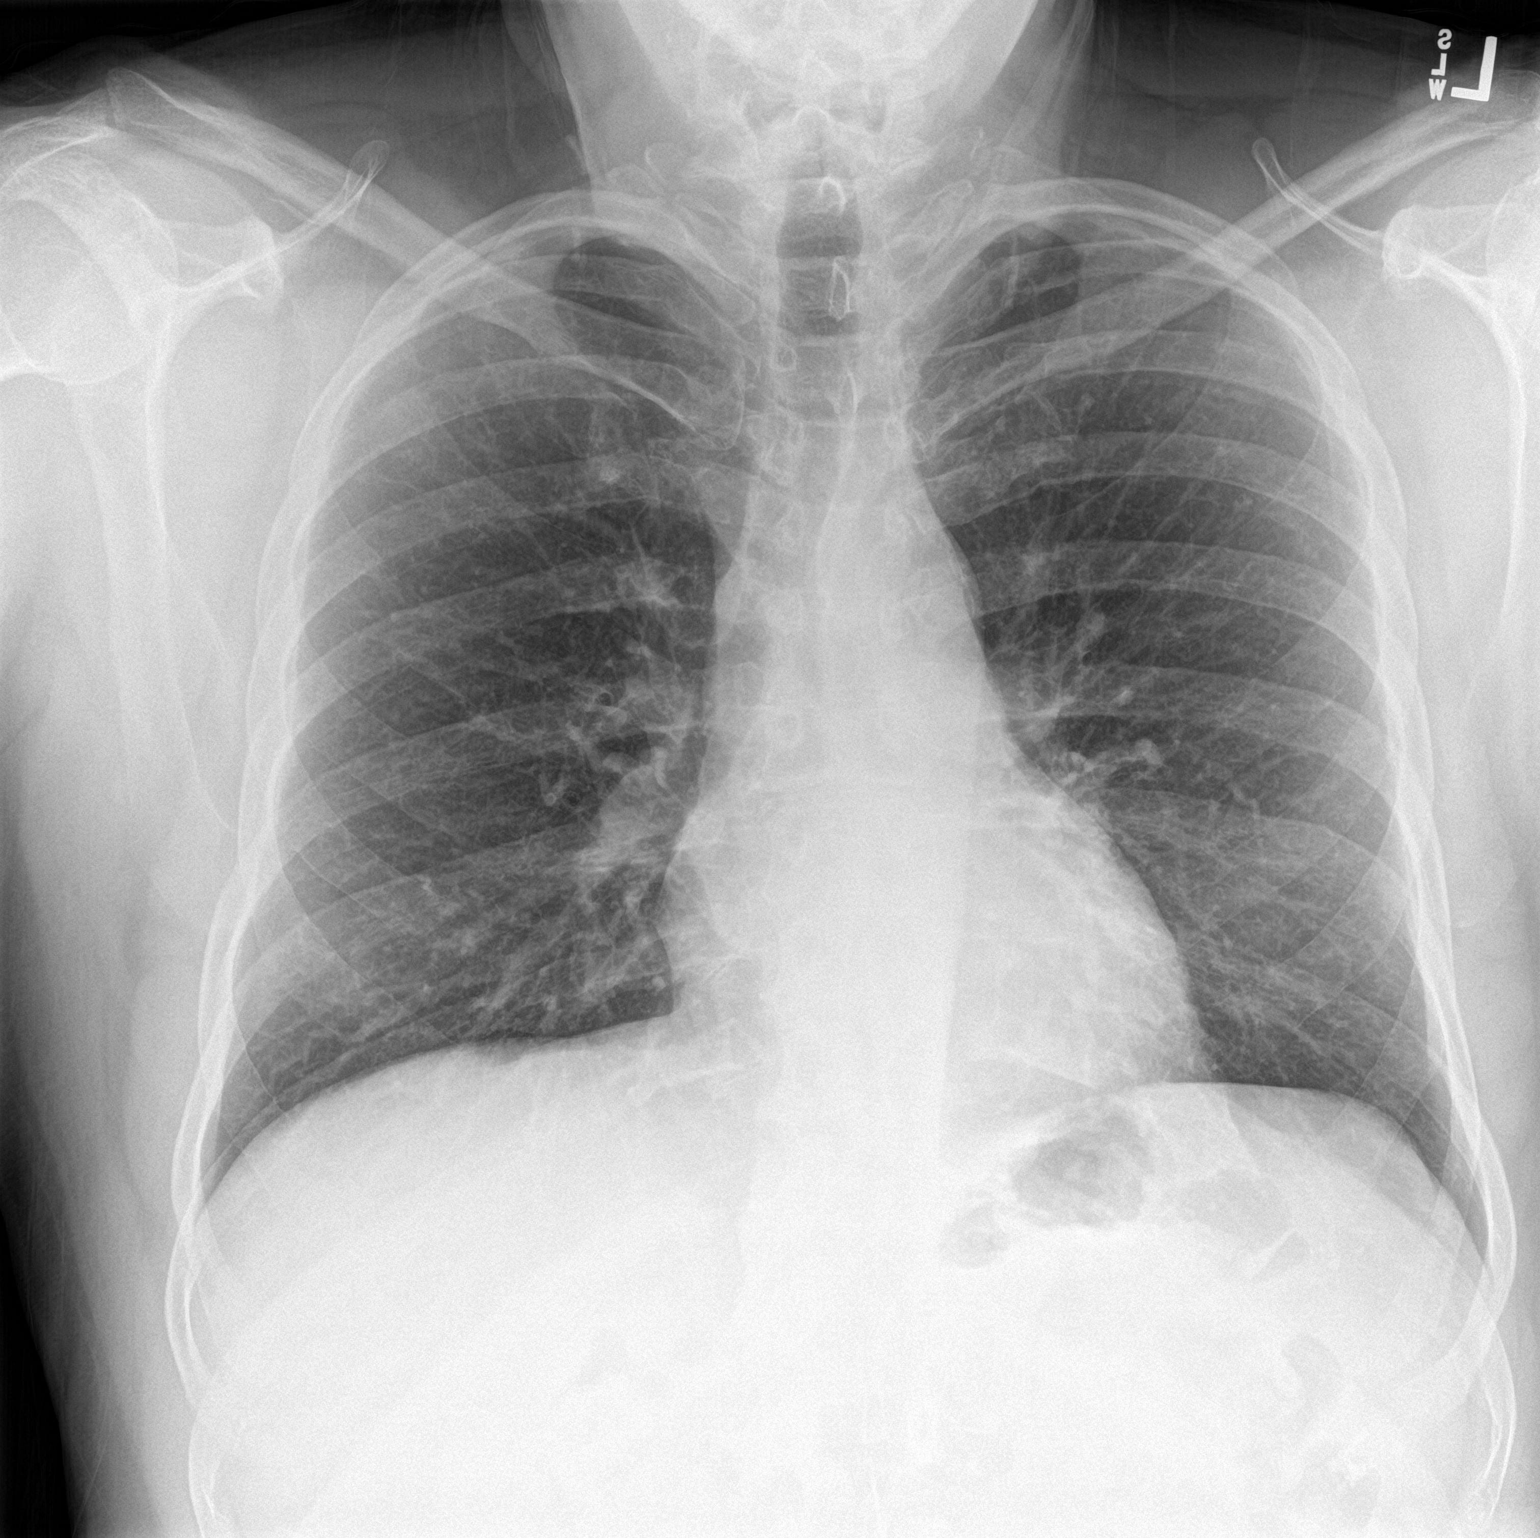

[chest lat]
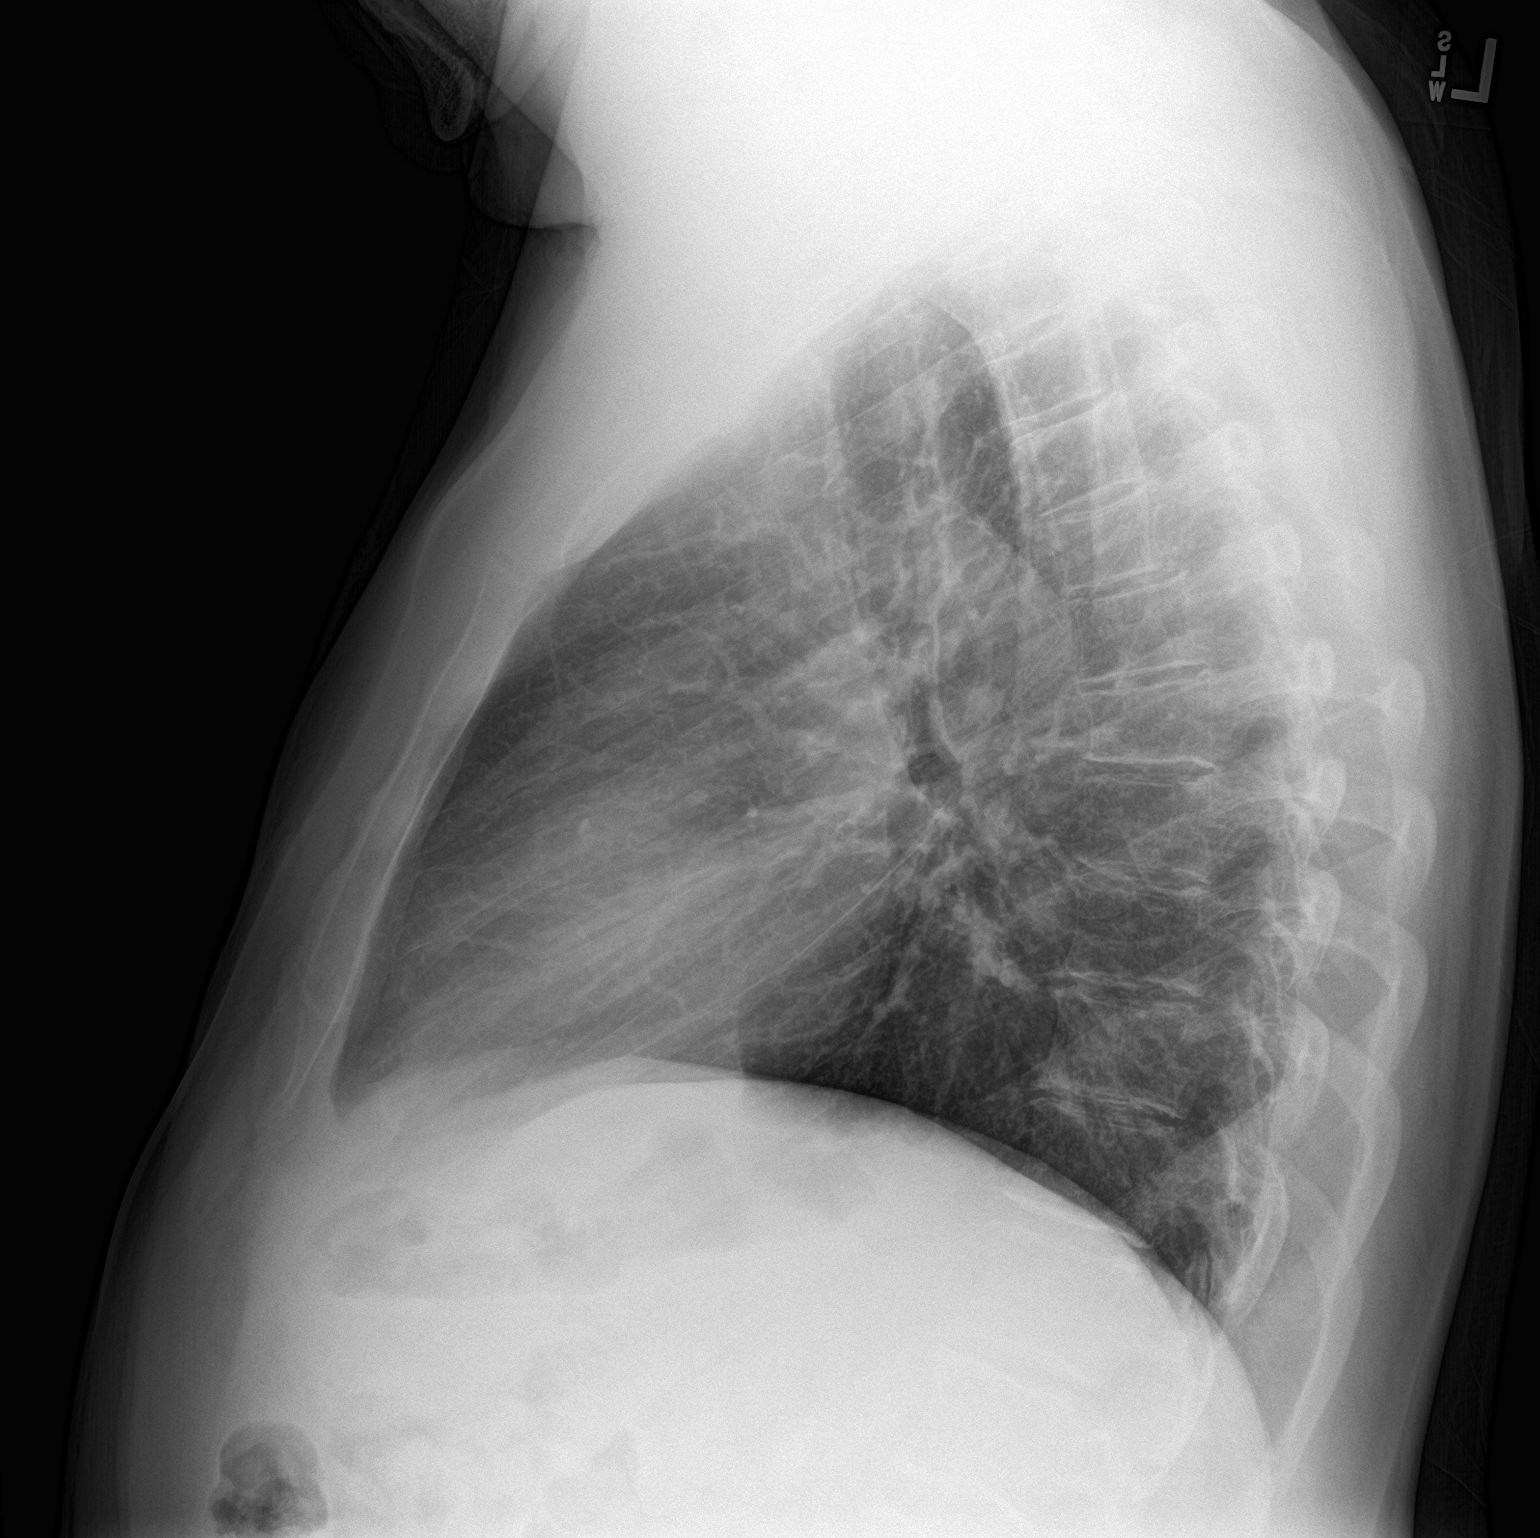

[2 of 2 positions shown; findings below may reference images not displayed]

FINDINGS: Cardiomediastinal silhouette is normal. Mediastinal contours appear
intact.

There is no evidence of focal airspace consolidation, pleural
effusion or pneumothorax.

Osseous structures are without acute abnormality. Soft tissues are
grossly normal.
IMPRESSION: No active cardiopulmonary disease.
